# Patient Record
Sex: Female | Born: 1975 | Race: White | Hispanic: No | Marital: Married | Smoking: Never smoker
Health system: Southern US, Community
[De-identification: ages and names within clinical notes are randomized; demographics above are authoritative.]

## PROBLEM LIST (undated history)

## (undated) DIAGNOSIS — F329 Major depressive disorder, single episode, unspecified: Secondary | ICD-10-CM

## (undated) DIAGNOSIS — F32A Depression, unspecified: Secondary | ICD-10-CM

## (undated) HISTORY — DX: Major depressive disorder, single episode, unspecified: F32.9

## (undated) HISTORY — DX: Depression, unspecified: F32.A

---

## 2002-07-13 ENCOUNTER — Inpatient Hospital Stay (HOSPITAL_COMMUNITY): Admission: AD | Admit: 2002-07-13 | Discharge: 2002-07-16 | Payer: Self-pay | Admitting: Obstetrics and Gynecology

## 2002-08-31 ENCOUNTER — Other Ambulatory Visit: Admission: RE | Admit: 2002-08-31 | Discharge: 2002-08-31 | Payer: Self-pay | Admitting: Obstetrics and Gynecology

## 2002-10-31 ENCOUNTER — Encounter: Admission: RE | Admit: 2002-10-31 | Discharge: 2002-11-30 | Payer: Self-pay | Admitting: Obstetrics and Gynecology

## 2002-12-01 ENCOUNTER — Encounter: Admission: RE | Admit: 2002-12-01 | Discharge: 2002-12-31 | Payer: Self-pay | Admitting: Obstetrics and Gynecology

## 2007-11-09 ENCOUNTER — Ambulatory Visit (HOSPITAL_COMMUNITY): Admission: RE | Admit: 2007-11-09 | Discharge: 2007-11-09 | Payer: Self-pay | Admitting: Obstetrics and Gynecology

## 2010-07-21 NOTE — Op Note (Signed)
NAMENANDA, BITTICK               ACCOUNT NO.:  [id number]   MEDICAL RECORD NO.:  [id number]          PATIENT TYPE:  AMB   LOCATION:  SDC                           FACILITY:  [facility]   PHYSICIAN:  Michelle L. Grewal, M.D.DATE OF BIRTH:  [birth year]   DATE OF PROCEDURE:  DATE OF DISCHARGE:                               OPERATIVE REPORT   PREOPERATIVE DIAGNOSIS:  Retained intrauterine device.   POSTOPERATIVE DIAGNOSIS:  Retained intrauterine device.   PROCEDURE:  Cervical dilation, removal of Mirena IUD and insertion of  new Mirena IUD.   SURGEON:  Michelle L. Vincente Poli, MD   ANESTHESIA:  Local with IV sedation.   FINDINGS:  IUD with string wrapped around the IUD apparatus.   ESTIMATED BLOOD LOSS:  Minimal.   PROCEDURE:  The patient was taken to the operating room after consent  was obtained.  She was prepped and draped in usual fashion.  In-and-out  catheter was used to empty the bladder.  The speculum was inserted into  the vagina.  The cervix was grasped with a tenaculum.  No IUD strings  were seen.  Paracervical block was performed in standard fashion.  The  cervix was gently dilated using Pratt dilators.  I then inserted a sharp  uterine curette and easily pulled the IUD down through the cervix.  The  strings were wrapped around the IUD apparatus; this is why it was not  removable in the office.  I then inserted a brand new Mirena IUD  according to [employer] specifications and the strings were trimmed.  There was minimal bleeding noted.  The patient tolerated the procedure  very well.  All sponge, lap, and instrument counts were correct x2.  The  patient went to recovery room in stable condition.      Michelle L. Vincente Poli, M.D.  Electronically Signed     MLG/MEDQ  D:  11/09/2007  T:  11/10/2007  Job:  [id number]

## 2010-07-24 NOTE — Op Note (Signed)
NAME:  Katherine Chavez, Katherine Chavez                         ACCOUNT NO.:  [id number]   MEDICAL RECORD NO.:  [id number]                   PATIENT TYPE:  INP   LOCATION:  9198                                 FACILITY:  [facility]   PHYSICIAN:  Michelle L. Vincente Poli, M.D.            DATE OF BIRTH:  Nov 07, 1975   DATE OF PROCEDURE:  07/13/2002  DATE OF DISCHARGE:                                 OPERATIVE REPORT   PREOPERATIVE DIAGNOSES:  1. Intrauterine pregnancy at term.  2. Previous cesarean section x2.   POSTOPERATIVE DIAGNOSES:  1. Intrauterine pregnancy at term.  2. Previous cesarean section x2.   PROCEDURE:  Repeat low transverse cesarean section.   SURGEON:  Michelle L. Vincente Poli, M.D.   ASSISTANT:  Guy Sandifer. Henderson Cloud, M.D.   ANESTHESIA:  Spinal.   ESTIMATED BLOOD LOSS:  500 cc.   FINDINGS:  A female infant; Apgars 8 at one minute and 9 at five minutes.   DESCRIPTION OF PROCEDURE:  The patient was taken to the operating room.  She  was given her spinal without incident.  She was then prepped and draped in  the usual sterile fashion.  The Foley catheter was with drain to the  bladder.  Using a scalpel, a low transverse incision was made at the area of  previous cesarean section scar.  It was carried down to the fascia.  The  fascia was scored in the midline and extended laterally.  The Pfannenstiel  incision was developed.  The rectus muscles were separated in the midline.  The peritoneum was entered bluntly.  The peritoneal incision was then  stretched.  The bladder blade was then introduced into the peritoneal  cavity, and the bladder flap was then created sharply and then digitally;  the bladder blade was then readjusted.  A low transverse incision was made  in the uterus.  The amniotic fluid was noted to be clear.  The baby was in  cephalic presentation; a female infant and was delivered quite easily.  Apgars were 8 at one minute and 9 at five minutes.  The baby was handed to  the awaiting  pediatricians after cord was clamped.  Cord blood was then  obtained.  The placenta was manually removed; noted to be normal and intact.  The uterus was then cleared of all clots and debris.  The uterine incision  was closed with a single layer, using 0 chromic in a continuous running  stitch.  This was inspected and noted to be hemostatic.  Irrigation was  performed and hemostasis was noted.  The peritoneum was closed using 0  Vicryl in continuous running stitch, and the rectus muscles were  reapproximated using the same 0 Vicryl.  The fascia was hemostatic.  The  fascia was closed using  0 Vicryl in continuous stitch x2, starting at each corner and meeting in the  midline.  After irrigation of the subcutaneous layer and noting hemostasis,  the skin was closed with staples.  All sponge, lap and instrument counts  were correct x2.  The patient tolerated the procedure well and went to the  recovery room in stable condition.                                               Michelle L. Vincente Poli, M.D.    Florestine Avers  D:  07/13/2002  T:  07/14/2002  Job:  [id number]

## 2010-07-24 NOTE — Discharge Summary (Signed)
NAME:  Katherine Chavez, RAHRIG                         ACCOUNT NO.:  [id number]   MEDICAL RECORD NO.:  [id number]                   PATIENT TYPE:  INP   LOCATION:  9135                                 [facility]ACILITY:  [facility]   PHYSICIAN:  Juluis Mire, M.D.                DATE OF BIRTH:  [birth year]   DATE OF ADMISSION:  07/13/2002  DATE OF DISCHARGE:  07/16/2002                                 DISCHARGE SUMMARY   ADMISSION DIAGNOSES:  1. Intrauterine pregnancy at term.  2. Previous cesarean delivery x2 desires repeat.   DISCHARGE DIAGNOSES:  1. Status post low transverse cesarean section.  2. Viable female infant.   PROCEDURE:  Repeat low transverse cesarean section.   REASON FOR ADMISSION:  Please see written H&P.   HOSPITAL COURSE:  The patient was a gravida 3, para 3 that was admitted at  term for a scheduled repeat cesarean delivery.  On the morning of her  surgery, the patient was prepped accordingly and taken to the operating room  where a spinal anesthesia was administered without difficulty.  A low  transverse incision was made with the delivery of a viable female infant  weighing 7 pounds 1 ounce with Apgars of 8 at one minute and 9 at five  minutes.  The patient tolerated the procedure well and was taken to the  recovery room in stable condition.  On postoperative day #1, the patient had  good return of bowel function.  Abdomen was soft, fundus was firm and  nontender.  Abdominal dressing was noted to be clean, dry, and intact.  Labs  revealed a hemoglobin of 7.2, platelet count of 141,000, WBC count of 8.  On  postoperative day #2 vital signs were stable, the patient was afebrile,  fundus was firm and nontender.  Abdominal dressing was removed revealing an  incision that was clean, dry, and intact.  The patient was tolerating a  regular diet without complaints of nausea and vomiting and was started on  iron supplementation.  The patient was Rh negative, the baby was determined  to be Rh positive.  RhoGAM was administered.  On postoperative day #3, the  fundus was firm and nontender, abdominal incision was clean, dry, and  intact.  The staples were removed and the patient was discharged home.   CONDITION ON DISCHARGE:  Good.   DIET:  Regular as tolerated.   ACTIVITY:  No heavy lifting, no driving x2 weeks, no vaginal entry.   FOLLOW UP:  The patient is to followup in the office in 1-2 weeks for an  incision check.  She is to call for a temperature greater than 100 degrees,  persistent nausea and vomiting, heavy vaginal bleeding, and/or redness or  drainage from the incisional site.   DISCHARGE MEDICATIONS:  1. Tylox, dispense #30, one p.o. every 4-6 hours p.r.n.  2.     Ferrous sulfate 325 mg on  p.o. b.i.d. with meals.  3. Prenatal vitamins one p.o. daily.  4. Colace one p.o. daily p.r.n.     Julio Sicks, N.P.                        Juluis Mire, M.D.    CC/MEDQ  D:  08/13/2002  T:  08/13/2002  Job:  [id number]

## 2010-12-09 LAB — CBC
MCHC: 33.7
Platelets: 257
RBC: 4.07
RDW: 12.3

## 2010-12-09 LAB — PREGNANCY, URINE: Preg Test, Ur: NEGATIVE

## 2013-02-22 ENCOUNTER — Other Ambulatory Visit: Payer: Self-pay | Admitting: Obstetrics and Gynecology

## 2013-02-22 DIAGNOSIS — R928 Other abnormal and inconclusive findings on diagnostic imaging of breast: Secondary | ICD-10-CM

## 2013-03-07 ENCOUNTER — Other Ambulatory Visit: Payer: Self-pay

## 2013-03-12 ENCOUNTER — Ambulatory Visit
Admission: RE | Admit: 2013-03-12 | Discharge: 2013-03-12 | Disposition: A | Discharge status: Home / Self Care | Payer: 59 | Source: Ambulatory Visit | Attending: Obstetrics and Gynecology | Admitting: Obstetrics and Gynecology

## 2013-03-12 DIAGNOSIS — R928 Other abnormal and inconclusive findings on diagnostic imaging of breast: Secondary | ICD-10-CM

## 2016-02-05 ENCOUNTER — Telehealth (HOSPITAL_COMMUNITY): Payer: Self-pay

## 2016-02-05 NOTE — Telephone Encounter (Signed)
PT l/m on 02/03/16 @ 1214pm / Attempted to call pt on 11/29 & 11/30 Not able to l/m on v/mail, because it's full. Please ask pt to call me at [phone]. [phone]

## 2016-02-12 ENCOUNTER — Telehealth (HOSPITAL_COMMUNITY): Payer: Self-pay

## 2016-02-12 NOTE — Telephone Encounter (Signed)
Called pt back on 02/12/16 @1002am  LMOM

## 2016-02-20 ENCOUNTER — Encounter (HOSPITAL_COMMUNITY): Payer: Self-pay

## 2016-04-20 NOTE — Progress Notes (Signed)
Psychiatric Initial Adult Assessment   Patient Identification: Katherine Chavez MRN:  [id number] Date of Evaluation:  04/21/2016 Referral Source: [facility] Chief Complaint:   Chief Complaint    Depression; New Evaluation     Visit Diagnosis: No diagnosis found.  History of Present Illness:   Katherine BerryLaura G Chavez is a 41 year old female with depression who is referred for depression.   Patient states that she came here for depression, although it took time for her to make an appointment. She talks about her daughter who overdosed on medication on her birthday in May 2017, and the oldest daughter who left house for college. She also talks about exchange student form [place] who will be leaving in June this year. She realized that her mood has been getting worse in October 2017 and she stayed in bed all day long until her children come back to the house. She reports difficulty sharing her emotion with her husband, as he will take it as who she is (with depression).   She endorses insomnia. She gained weight due to increased appetite. She endorses anhedonia and does not do any activity although she used to enjoy P[employer] committed. She denies SI, stating that she has children. She reports anxiety, panic attacks and tends to ruminate on things. She denies AH/VH. She denies decreased need for sleep or euphoria. She drinks a glass of wine at times. She denies drug use.  Associated Signs/Symptoms: Depression Symptoms:  depressed mood, anhedonia, insomnia, fatigue, feelings of worthlessness/guilt, difficulty concentrating, (Hypo) Manic Symptoms:  denies Anxiety Symptoms:  Excessive Worry, Panic Symptoms, Psychotic Symptoms:  denies PTSD Symptoms: Negative  Past Psychiatric History:  Outpatient: denies  Psychiatry admission: once at age 41 for depression, was "not functioning" Previous suicide attempt: denies Past trials of medication: lithium, sertraline,  History of  violence: denies  Previous Psychotropic Medications: Yes   Substance Abuse History in the last 12 months:  No.  Consequences of Substance Abuse: NA  Past Medical History: History reviewed. No pertinent past medical history. History reviewed. No pertinent surgical history.  Family Psychiatric History:  42 year old daughter attempted suicide, maternal side of her family has excessive alcohol use   Family History: History reviewed. No pertinent family history.  Social History:   Social History   Social History  . Marital status: Married    Spouse name: N/A  . Number of children: N/A  . Years of education: N/A   Social History Main Topics  . Smoking status: None  . Smokeless tobacco: None  . Alcohol use None  . Drug use: Unknown  . Sexual activity: Not Asked   Other Topics Concern  . None   Social History Narrative  . None    Additional Social History:  Lives in [place] for nine years Married for 20 years, has three children age 42,16,13 Born and grew up in [place] until [place] school, she reports her relationship with her parents are not close Education: college  Allergies:  Allergies not on file  Metabolic Disorder Labs: No results found for: HGBA1C, MPG No results found for: PROLACTIN No results found for: CHOL, TRIG, HDL, CHOLHDL, VLDL, LDLCALC   Current Medications: Current Outpatient Prescriptions  Medication Sig Dispense Refill  . traZODone (DESYREL) 100 MG tablet Take 100 mg by mouth at bedtime.     No current facility-administered medications for this visit.     Neurologic: Headache: No Seizure: No Paresthesias:No  Musculoskeletal: Strength & Muscle Tone: within normal limits  Gait & Station: normal Patient leans: N/A  Psychiatric Specialty Exam: Review of Systems  Psychiatric/Behavioral: Positive for depression. Negative for hallucinations, substance abuse and suicidal ideas. The patient is nervous/anxious and has insomnia.   All other  systems reviewed and are negative.   Blood pressure 128/88, pulse 99, height 5\' 6"  (1.676 m), weight 169 lb 3.2 oz (76.7 kg).Body mass index is 27.31 kg/m.  General Appearance: Fairly Groomed  Eye Contact:  Good  Speech:  Clear and Coherent  Volume:  Normal  Mood:  Depressed  Affect:  Tearful  Thought Process:  Coherent and Goal Directed  Orientation:  Full (Time, Place, and Person)  Thought Content:  Logical Perceptions: denies AH/VH  Suicidal Thoughts:  No  Homicidal Thoughts:  No  Memory:  Immediate;   Good Recent;   Good Remote;   Good  Judgement:  Good  Insight:  Fair  Psychomotor Activity:  Normal  Concentration:  Concentration: Good and Attention Span: Good  Recall:  Good  Fund of Knowledge:Good  Language: Good  Akathisia:  No  Handed:  Right  AIMS (if indicated):  N/A  Assets:  Communication Skills Desire for Improvement  ADL's:  Intact  Cognition: WNL  Sleep:  poor   Assessment Katherine Chavez is a 41 year old female with depression who is referred for depression.   # MDD Exam is notable for her tearful affect and patient endorses neurovegetative symptoms in the setting of suicide of her daughter last year and the other daughter left for college. Will start fluoxetine to target her mood symptoms. Discussed behavioral activation. She will greatly benefit from supportive therapy/CBT; will make a referral.  # Insomnia Discussed sleep hygiene. Will start Ambien for a short term. Discussed risk of sedation and dependence. Patient is advised to hold Trazodone when she takes Ambien.   Plan 1. Start fluoxetine 10 mg daily for two weeks, then 20 mg daily 2. Start Ambien 5 mg at night as needed for sleep 3. Return to clinic in one month 4. Make an appointment with a therapist  The patient demonstrates the following risk factors for suicide: Chronic risk factors for suicide include: psychiatric disorder of depression. Acute risk factors for suicide include: family or  marital conflict and unemployment. Protective factors for this patient include: responsibility to others (children, family), coping skills and hope for the future. Considering these factors, the overall suicide risk at this point appears to be low. Patient is appropriate for outpatient follow up.   Treatment Plan Summary: Plan as above   Neysa Hotter, MD 2/14/20182:13 PM

## 2016-04-21 ENCOUNTER — Ambulatory Visit (INDEPENDENT_AMBULATORY_CARE_PROVIDER_SITE_OTHER): Payer: 59 | Admitting: Psychiatry

## 2016-04-21 ENCOUNTER — Encounter (HOSPITAL_COMMUNITY): Payer: Self-pay | Admitting: Psychiatry

## 2016-04-21 ENCOUNTER — Encounter (INDEPENDENT_AMBULATORY_CARE_PROVIDER_SITE_OTHER): Payer: Self-pay

## 2016-04-21 VITALS — BP 128/88 | HR 99 | Ht 66.0 in | Wt 169.2 lb

## 2016-04-21 DIAGNOSIS — G4700 Insomnia, unspecified: Secondary | ICD-10-CM | POA: Diagnosis not present

## 2016-04-21 DIAGNOSIS — Z811 Family history of alcohol abuse and dependence: Secondary | ICD-10-CM | POA: Diagnosis not present

## 2016-04-21 DIAGNOSIS — Z79899 Other long term (current) drug therapy: Secondary | ICD-10-CM

## 2016-04-21 DIAGNOSIS — F331 Major depressive disorder, recurrent, moderate: Secondary | ICD-10-CM | POA: Diagnosis not present

## 2016-04-21 DIAGNOSIS — Z818 Family history of other mental and behavioral disorders: Secondary | ICD-10-CM

## 2016-04-21 MED ORDER — FLUOXETINE HCL 10 MG PO TABS: ORAL_TABLET | ORAL | 1 refills | Status: AC

## 2016-04-21 MED ORDER — ZOLPIDEM TARTRATE 5 MG PO TABS: 5.0000 mg | ORAL_TABLET | Freq: Every evening | ORAL | 0 refills | Status: AC | PRN

## 2016-04-21 NOTE — Patient Instructions (Addendum)
1. Start fluoxetine 10 mg daily for two weeks, then 20 mg daily 2. Start Ambien 5 mg at night as needed for sleep 3. Return to clinic in one month 4. Make an appointment with a therapist

## 2016-05-31 ENCOUNTER — Ambulatory Visit (HOSPITAL_COMMUNITY): Payer: Self-pay | Admitting: Psychiatry

## 2017-12-13 ENCOUNTER — Other Ambulatory Visit: Payer: Self-pay | Admitting: Obstetrics and Gynecology

## 2017-12-13 DIAGNOSIS — N6459 Other signs and symptoms in breast: Secondary | ICD-10-CM

## 2017-12-15 ENCOUNTER — Ambulatory Visit
Admission: RE | Admit: 2017-12-15 | Discharge: 2017-12-15 | Disposition: A | Discharge status: Home / Self Care | Payer: 59 | Source: Ambulatory Visit | Attending: Obstetrics and Gynecology | Admitting: Obstetrics and Gynecology

## 2017-12-15 DIAGNOSIS — N6459 Other signs and symptoms in breast: Secondary | ICD-10-CM

## 2018-04-11 ENCOUNTER — Other Ambulatory Visit: Payer: Self-pay

## 2018-04-11 ENCOUNTER — Encounter (HOSPITAL_BASED_OUTPATIENT_CLINIC_OR_DEPARTMENT_OTHER): Payer: Self-pay | Admitting: Emergency Medicine

## 2018-04-11 DIAGNOSIS — S0181XA Laceration without foreign body of other part of head, initial encounter: Secondary | ICD-10-CM | POA: Diagnosis not present

## 2018-04-11 DIAGNOSIS — W19XXXA Unspecified fall, initial encounter: Secondary | ICD-10-CM | POA: Diagnosis not present

## 2018-04-11 DIAGNOSIS — Y939 Activity, unspecified: Secondary | ICD-10-CM | POA: Diagnosis not present

## 2018-04-11 DIAGNOSIS — Y999 Unspecified external cause status: Secondary | ICD-10-CM | POA: Diagnosis not present

## 2018-04-11 DIAGNOSIS — Y929 Unspecified place or not applicable: Secondary | ICD-10-CM | POA: Diagnosis not present

## 2018-04-11 NOTE — ED Triage Notes (Signed)
Pt having a small laceration on her chin after she fell tonight. Wound cleaned and dressing applied on triage.

## 2018-04-12 ENCOUNTER — Emergency Department (HOSPITAL_BASED_OUTPATIENT_CLINIC_OR_DEPARTMENT_OTHER)
Admission: EM | Admit: 2018-04-12 | Discharge: 2018-04-12 | Disposition: A | Discharge status: Home / Self Care | Payer: BLUE CROSS/BLUE SHIELD | Attending: Emergency Medicine | Admitting: Emergency Medicine

## 2018-04-12 DIAGNOSIS — Z23 Encounter for immunization: Secondary | ICD-10-CM | POA: Diagnosis not present

## 2018-04-12 DIAGNOSIS — Z043 Encounter for examination and observation following other accident: Secondary | ICD-10-CM | POA: Diagnosis not present

## 2018-04-12 DIAGNOSIS — S0181XA Laceration without foreign body of other part of head, initial encounter: Secondary | ICD-10-CM | POA: Diagnosis not present

## 2018-04-12 DIAGNOSIS — G8911 Acute pain due to trauma: Secondary | ICD-10-CM | POA: Diagnosis not present

## 2018-04-14 DIAGNOSIS — S0181XA Laceration without foreign body of other part of head, initial encounter: Secondary | ICD-10-CM | POA: Diagnosis not present

## 2018-05-09 ENCOUNTER — Encounter (HOSPITAL_COMMUNITY): Payer: Self-pay

## 2018-05-09 ENCOUNTER — Other Ambulatory Visit: Payer: Self-pay

## 2018-05-09 ENCOUNTER — Emergency Department (HOSPITAL_COMMUNITY)
Admission: EM | Admit: 2018-05-09 | Discharge: 2018-05-09 | Disposition: A | Discharge status: Home / Self Care | Payer: BLUE CROSS/BLUE SHIELD | Attending: Emergency Medicine | Admitting: Emergency Medicine

## 2018-05-09 DIAGNOSIS — F329 Major depressive disorder, single episode, unspecified: Secondary | ICD-10-CM | POA: Insufficient documentation

## 2018-05-09 DIAGNOSIS — Z5321 Procedure and treatment not carried out due to patient leaving prior to being seen by health care provider: Secondary | ICD-10-CM | POA: Diagnosis not present

## 2018-05-09 LAB — COMPREHENSIVE METABOLIC PANEL
ALBUMIN: 4.5 g/dL (ref 3.5–5.0)
ALK PHOS: 64 U/L (ref 38–126)
ALT: 12 U/L (ref 0–44)
AST: 22 U/L (ref 15–41)
Anion gap: 10 (ref 5–15)
BUN: 10 mg/dL (ref 6–20)
CALCIUM: 9.6 mg/dL (ref 8.9–10.3)
CO2: 24 mmol/L (ref 22–32)
Chloride: 103 mmol/L (ref 98–111)
Creatinine, Ser: 0.68 mg/dL (ref 0.44–1.00)
GFR calc Af Amer: >60 mL/min (ref >60)
GFR calc non Af Amer: >60 mL/min (ref >60)
GLUCOSE: 102 mg/dL — AB (ref 70–99)
Potassium: 4.3 mmol/L (ref 3.5–5.1)
SODIUM: 137 mmol/L (ref 135–145)
Total Bilirubin: 0.5 mg/dL (ref 0.3–1.2)
Total Protein: 7.9 g/dL (ref 6.5–8.1)

## 2018-05-09 LAB — RAPID URINE DRUG SCREEN, HOSP PERFORMED
Amphetamines: NOT DETECTED
BARBITURATES: NOT DETECTED
Benzodiazepines: POSITIVE — AB
Cocaine: NOT DETECTED
OPIATES: NOT DETECTED
TETRAHYDROCANNABINOL: NOT DETECTED

## 2018-05-09 LAB — CBC
HEMATOCRIT: 38.1 % (ref 36.0–46.0)
HEMOGLOBIN: 12.2 g/dL (ref 12.0–15.0)
MCH: 31.5 pg (ref 26.0–34.0)
MCHC: 32 g/dL (ref 30.0–36.0)
MCV: 98.4 fL (ref 80.0–100.0)
Platelets: 265 10*3/uL (ref 150–400)
RBC: 3.87 MIL/uL (ref 3.87–5.11)
RDW: 12.7 % (ref 11.5–15.5)
WBC: 5 10*3/uL (ref 4.0–10.5)
nRBC: 0 % (ref 0.0–0.2)

## 2018-05-09 LAB — I-STAT BETA HCG BLOOD, ED (MC, WL, AP ONLY): I-stat hCG, quantitative: <5 m[IU]/mL (ref <5)

## 2018-05-09 LAB — SALICYLATE LEVEL: Salicylate Lvl: <7 mg/dL (ref 2.8–30.0)

## 2018-05-09 LAB — ACETAMINOPHEN LEVEL: Acetaminophen (Tylenol), Serum: <10 ug/mL — ABNORMAL LOW (ref 10–30)

## 2018-05-09 LAB — ETHANOL: Alcohol, Ethyl (B): <10 mg/dL (ref <10)

## 2018-05-09 NOTE — ED Notes (Addendum)
No reply for registration x4.

## 2018-05-09 NOTE — ED Triage Notes (Signed)
Pt reports increased depression for the past few months but has gotten worse lately. Pt denies SI/HI, no auditory or visual hallucinations. Pt denies drug use to does report drinking a bottle of wine a day. Pt calm and cooperative in triage.

## 2018-05-09 NOTE — ED Notes (Addendum)
Pt not seen in lobby at this time. Pt called for x4 no reply.

## 2018-05-10 DIAGNOSIS — I10 Essential (primary) hypertension: Secondary | ICD-10-CM | POA: Diagnosis not present

## 2018-05-10 DIAGNOSIS — F332 Major depressive disorder, recurrent severe without psychotic features: Secondary | ICD-10-CM | POA: Diagnosis not present

## 2018-05-10 DIAGNOSIS — F1020 Alcohol dependence, uncomplicated: Secondary | ICD-10-CM | POA: Diagnosis not present

## 2018-05-11 DIAGNOSIS — F332 Major depressive disorder, recurrent severe without psychotic features: Secondary | ICD-10-CM | POA: Diagnosis not present

## 2018-05-11 DIAGNOSIS — F1020 Alcohol dependence, uncomplicated: Secondary | ICD-10-CM | POA: Diagnosis not present

## 2018-05-12 DIAGNOSIS — F332 Major depressive disorder, recurrent severe without psychotic features: Secondary | ICD-10-CM | POA: Diagnosis not present

## 2018-05-12 DIAGNOSIS — F1020 Alcohol dependence, uncomplicated: Secondary | ICD-10-CM | POA: Diagnosis not present

## 2018-05-13 DIAGNOSIS — F1020 Alcohol dependence, uncomplicated: Secondary | ICD-10-CM | POA: Diagnosis not present

## 2018-05-13 DIAGNOSIS — F332 Major depressive disorder, recurrent severe without psychotic features: Secondary | ICD-10-CM | POA: Diagnosis not present

## 2018-05-14 DIAGNOSIS — F332 Major depressive disorder, recurrent severe without psychotic features: Secondary | ICD-10-CM | POA: Diagnosis not present

## 2018-05-14 DIAGNOSIS — F1020 Alcohol dependence, uncomplicated: Secondary | ICD-10-CM | POA: Diagnosis not present

## 2018-05-15 DIAGNOSIS — F332 Major depressive disorder, recurrent severe without psychotic features: Secondary | ICD-10-CM | POA: Diagnosis not present

## 2018-05-16 DIAGNOSIS — F332 Major depressive disorder, recurrent severe without psychotic features: Secondary | ICD-10-CM | POA: Diagnosis not present

## 2018-05-16 DIAGNOSIS — F1020 Alcohol dependence, uncomplicated: Secondary | ICD-10-CM | POA: Diagnosis not present

## 2018-06-04 DIAGNOSIS — S335XXA Sprain of ligaments of lumbar spine, initial encounter: Secondary | ICD-10-CM | POA: Diagnosis not present

## 2018-06-28 DIAGNOSIS — M545 Low back pain: Secondary | ICD-10-CM | POA: Diagnosis not present

## 2018-09-29 DIAGNOSIS — Z1159 Encounter for screening for other viral diseases: Secondary | ICD-10-CM | POA: Diagnosis not present

## 2018-11-06 DIAGNOSIS — Z1159 Encounter for screening for other viral diseases: Secondary | ICD-10-CM | POA: Diagnosis not present

## 2018-11-29 DIAGNOSIS — M79642 Pain in left hand: Secondary | ICD-10-CM | POA: Diagnosis not present

## 2018-12-15 DIAGNOSIS — Z20828 Contact with and (suspected) exposure to other viral communicable diseases: Secondary | ICD-10-CM | POA: Diagnosis not present

## 2018-12-15 DIAGNOSIS — Z03818 Encounter for observation for suspected exposure to other biological agents ruled out: Secondary | ICD-10-CM | POA: Diagnosis not present

## 2019-01-13 DIAGNOSIS — Z03818 Encounter for observation for suspected exposure to other biological agents ruled out: Secondary | ICD-10-CM | POA: Diagnosis not present

## 2019-01-13 DIAGNOSIS — Z20828 Contact with and (suspected) exposure to other viral communicable diseases: Secondary | ICD-10-CM | POA: Diagnosis not present

## 2019-01-31 ENCOUNTER — Other Ambulatory Visit: Payer: Self-pay

## 2019-01-31 DIAGNOSIS — Z20822 Contact with and (suspected) exposure to covid-19: Secondary | ICD-10-CM

## 2019-02-01 LAB — NOVEL CORONAVIRUS, NAA: SARS-CoV-2, NAA: NOT DETECTED

## 2019-06-13 DIAGNOSIS — S335XXA Sprain of ligaments of lumbar spine, initial encounter: Secondary | ICD-10-CM | POA: Diagnosis not present

## 2019-06-16 IMAGING — MG DIGITAL DIAGNOSTIC BILATERAL MAMMOGRAM WITH TOMO AND CAD
8 series · 8 of 24 positions shown · non-contrast
Comparison: Previous screening mammogram dated 02/19/2013.

CLINICAL DATA: Patient describes thickening with tenderness in the
outer LEFT breast since [REDACTED], improved status post course of
antibiotics for presumed mastitis.

EXAM:
DIGITAL DIAGNOSTIC BILATERAL MAMMOGRAM WITH CAD AND TOMO
ULTRASOUND LEFT BREAST

[R MLO synth-2D]
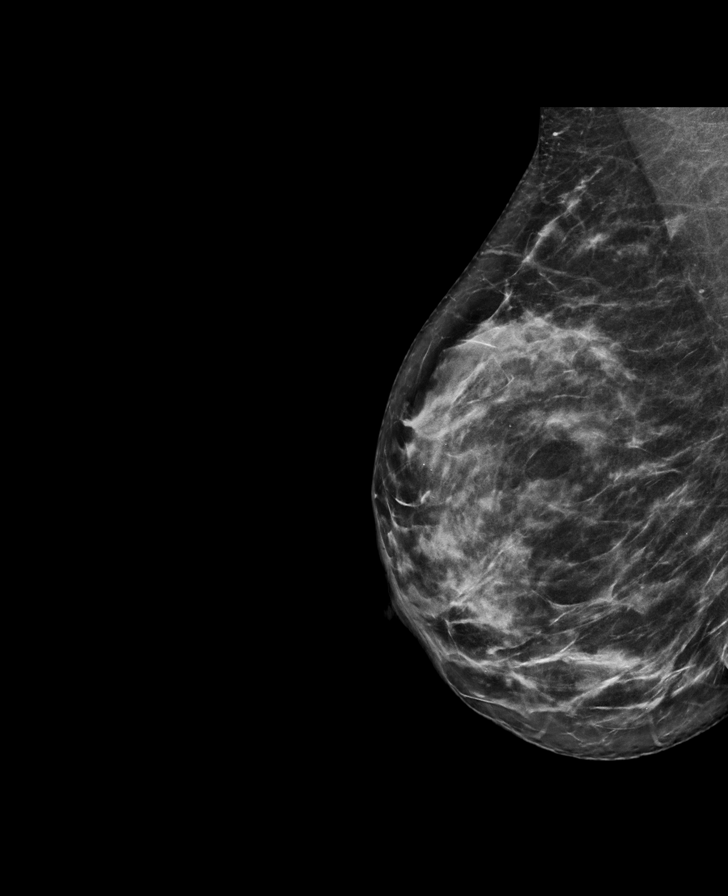

[L CC synth-2D]
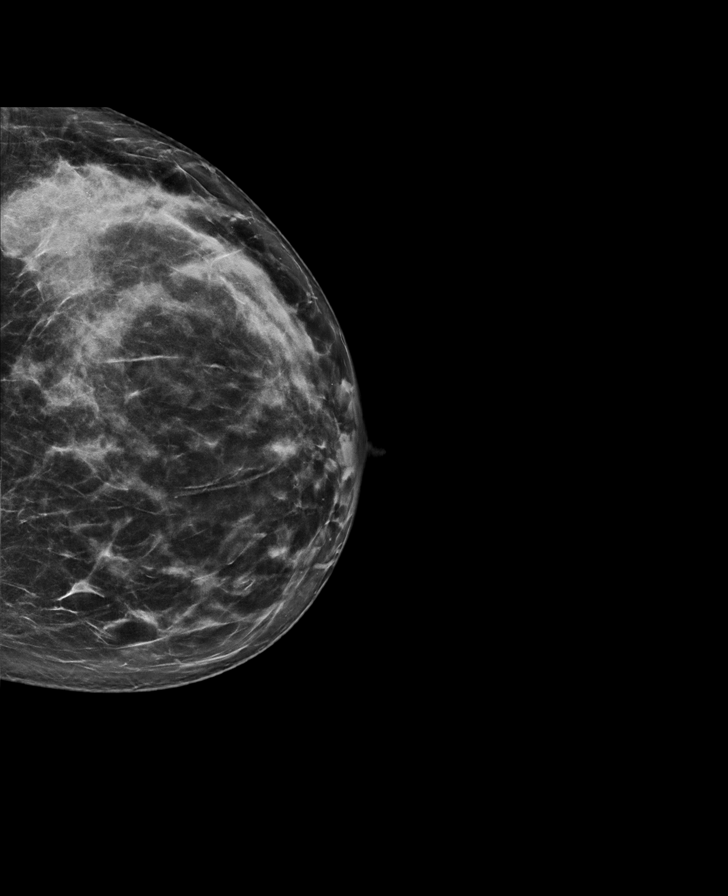

[R CC synth-2D]
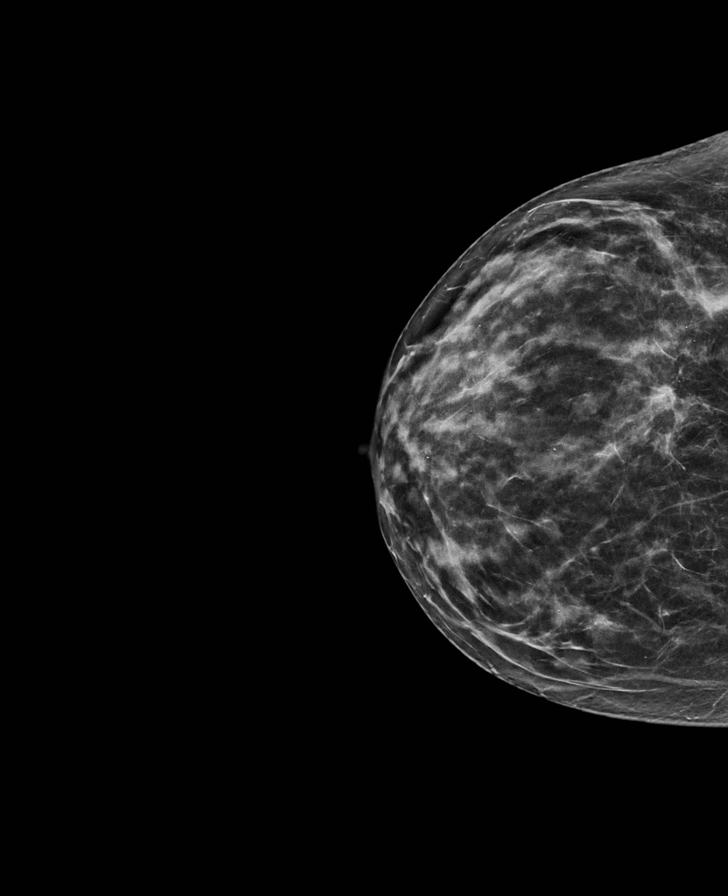

[L MLO synth-2D]
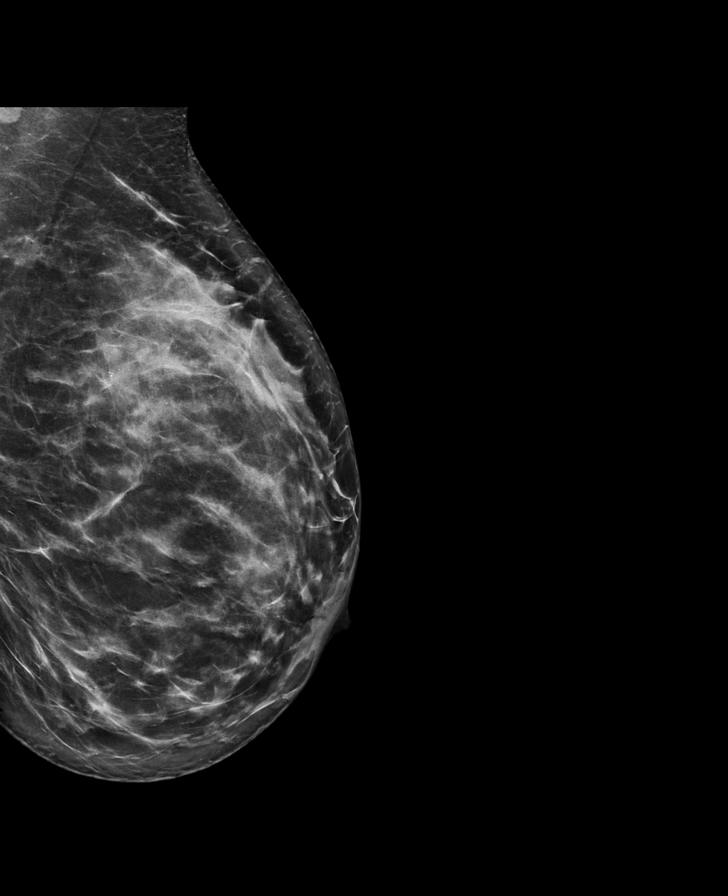

[R CC tomo · tomo slice 31/62.0]
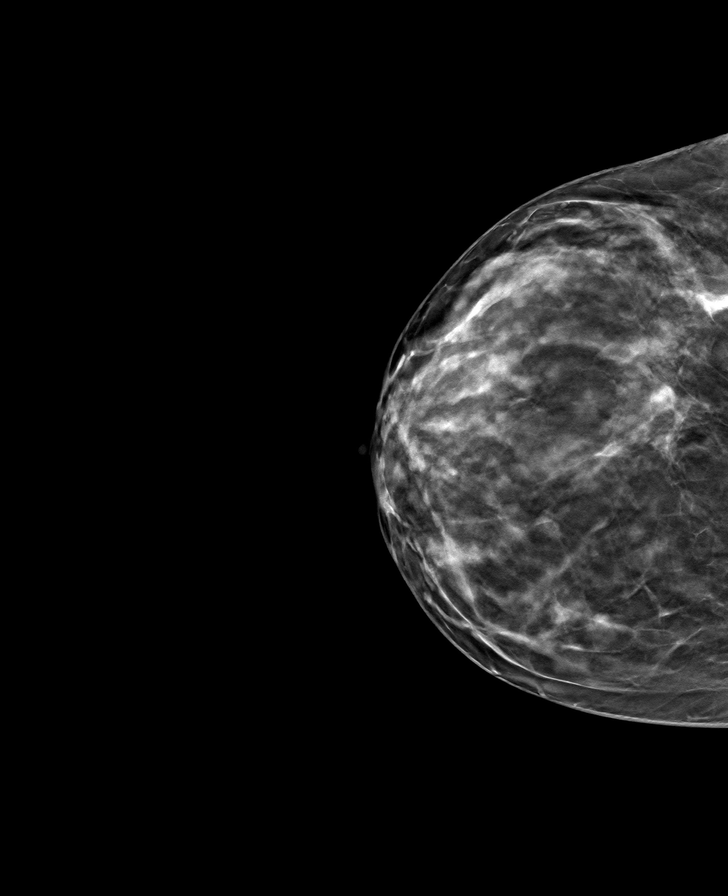

[R MLO tomo · tomo slice 36/71.0]
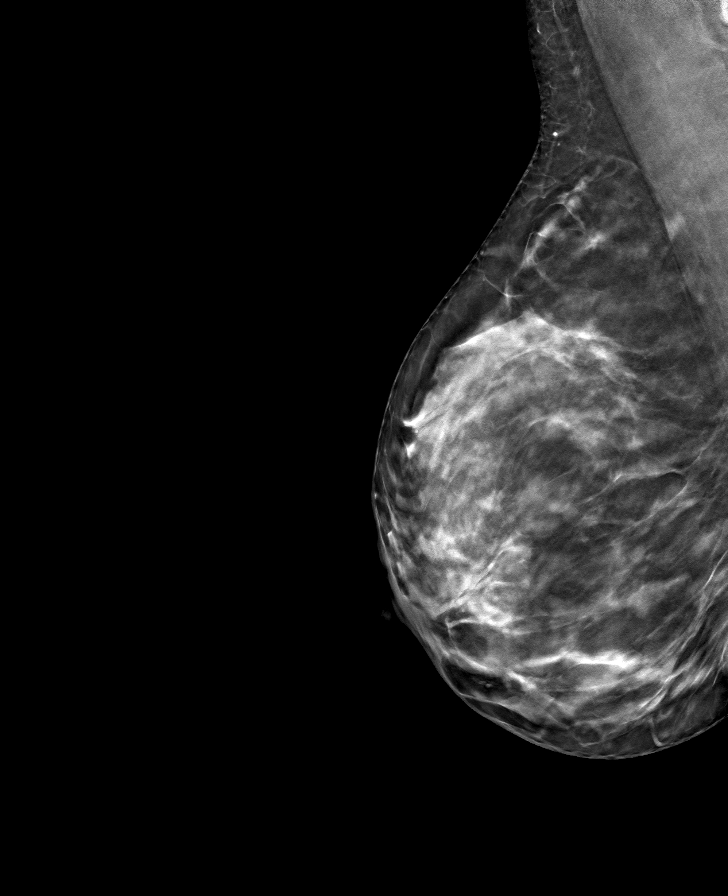

[L CC tomo · tomo slice 37/73.0]
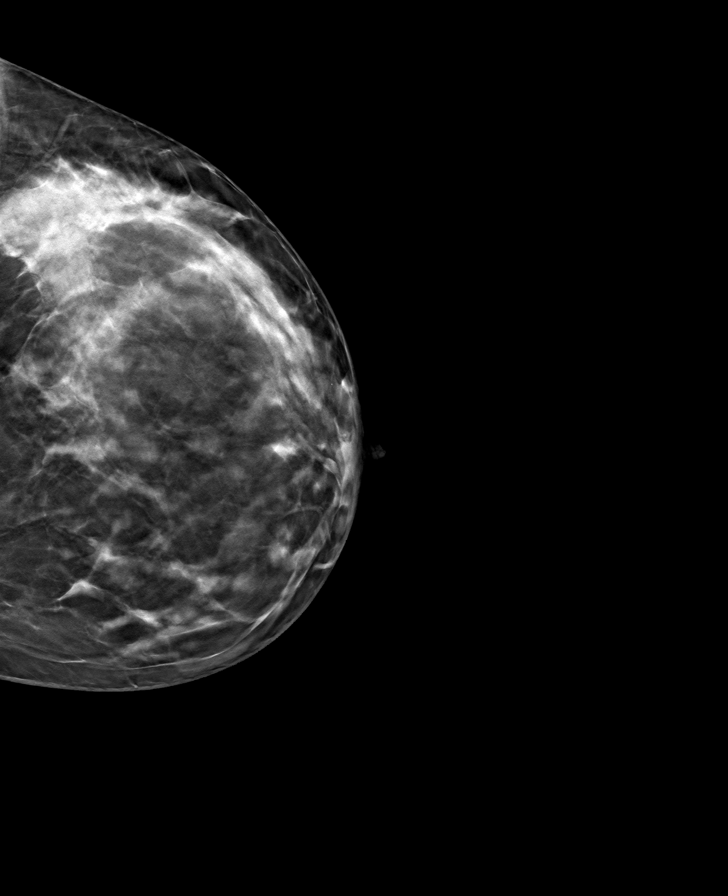

[L MLO tomo · tomo slice 39/76.0]
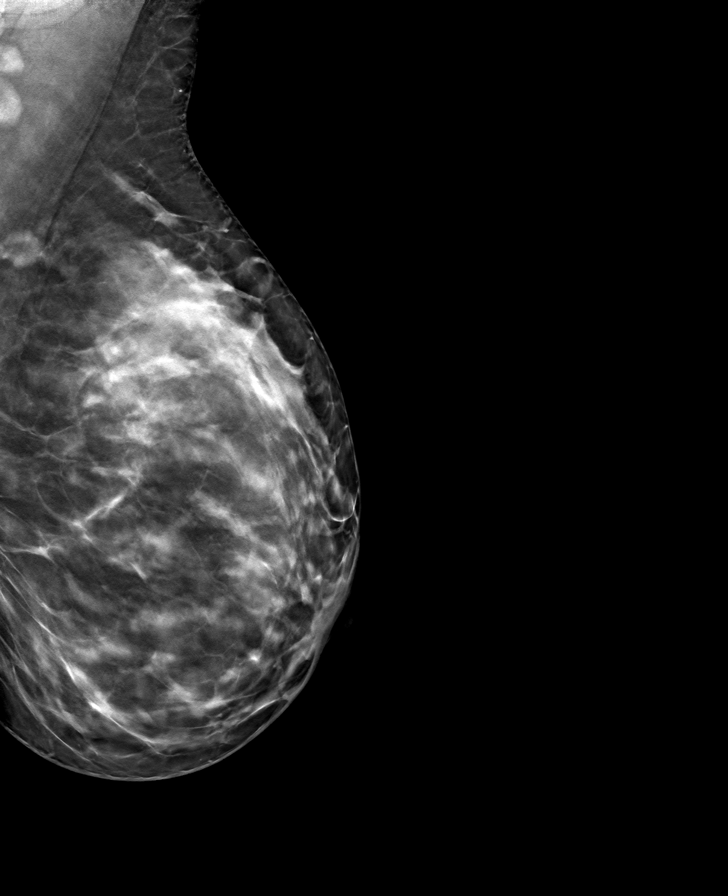

[8 of 24 positions shown; findings below may reference images not displayed]

ACR Breast Density Category c: The breast tissue is heterogeneously
dense, which may obscure small masses.
FINDINGS: There are no new dominant masses, suspicious calcifications or
secondary signs of malignancy within either breast. Specifically,
there is no mammographic abnormality within the outer LEFT breast
corresponding to the area of clinical concern.

Mammographic images were processed with CAD.

On physical exam, there is no skin redness today. There is vague
thickening within the outer LEFT breast without evidence of
circumscribed mass.

Targeted ultrasound is performed, evaluating the outer LEFT breast
as directed by the patient, showing only normal fibroglandular
tissues and fat lobules. There is a ridge of normal dense
fibroglandular tissue within the LEFT breast at the 2-4 o'clock axis
corresponding to the area of clinical concern. No solid or cystic
mass. No fluid collection or abscess. No skin thickening.
IMPRESSION: No evidence of malignancy within either breast. Specifically, no
evidence of malignancy or abscess within the outer LEFT breast
corresponding to the area of clinical concern. Patient states that
the skin redness has resolved and that the firmness/thickening
within the outer LEFT breast has decreased suggesting a resolved
mastitis.

RECOMMENDATION:
1.  Screening mammogram in one year.(Code:20-U-CUD)
2. The patient was instructed to return sooner if the area that she
feels becomes larger and/or firmer to palpation, if the skin redness
returns or if a new palpable abnormality is identified in either
breast.

I have discussed the findings and recommendations with the patient.
Results were also provided in writing at the conclusion of the
visit. If applicable, a reminder letter will be sent to the patient
regarding the next appointment.

BI-RADS CATEGORY  1: Negative.

## 2019-06-16 IMAGING — US ULTRASOUND LEFT BREAST LIMITED
1 series · 3 of 3 positions shown · non-contrast
Comparison: Previous screening mammogram dated 02/19/2013.

CLINICAL DATA: Patient describes thickening with tenderness in the
outer LEFT breast since [REDACTED], improved status post course of
antibiotics for presumed mastitis.

EXAM:
DIGITAL DIAGNOSTIC BILATERAL MAMMOGRAM WITH CAD AND TOMO
ULTRASOUND LEFT BREAST

[Series 1: ultrasound left breast limited · 0.07mm/px · 3 of 3 slices shown]
[im 1/3]
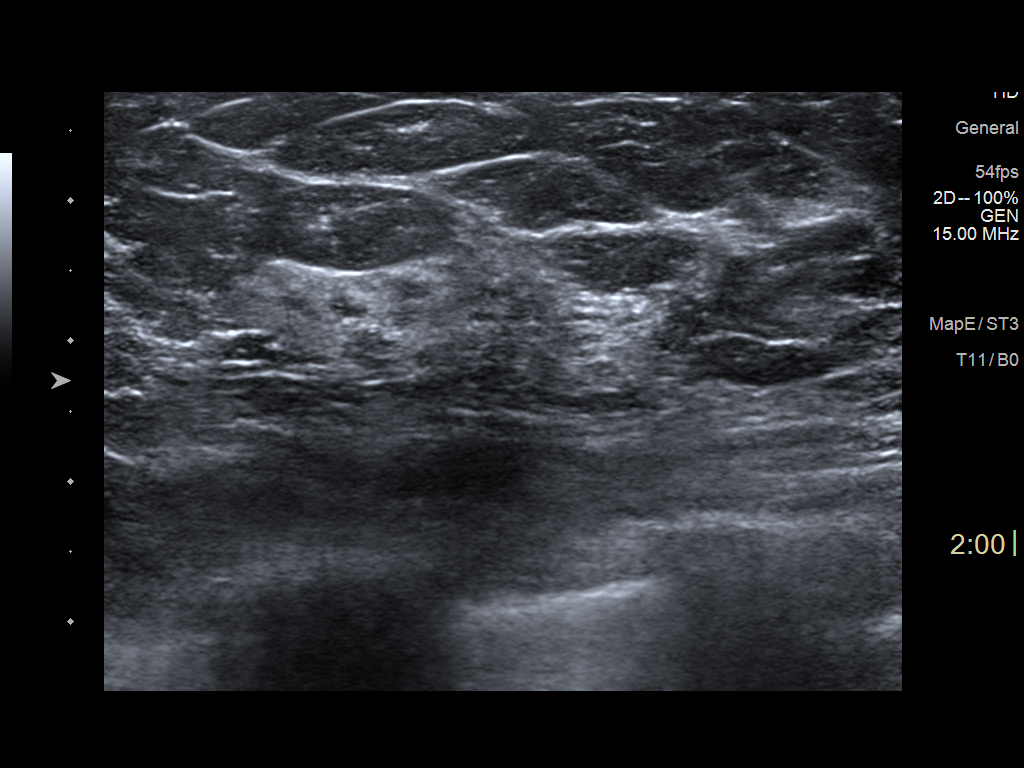
[im 2/3]
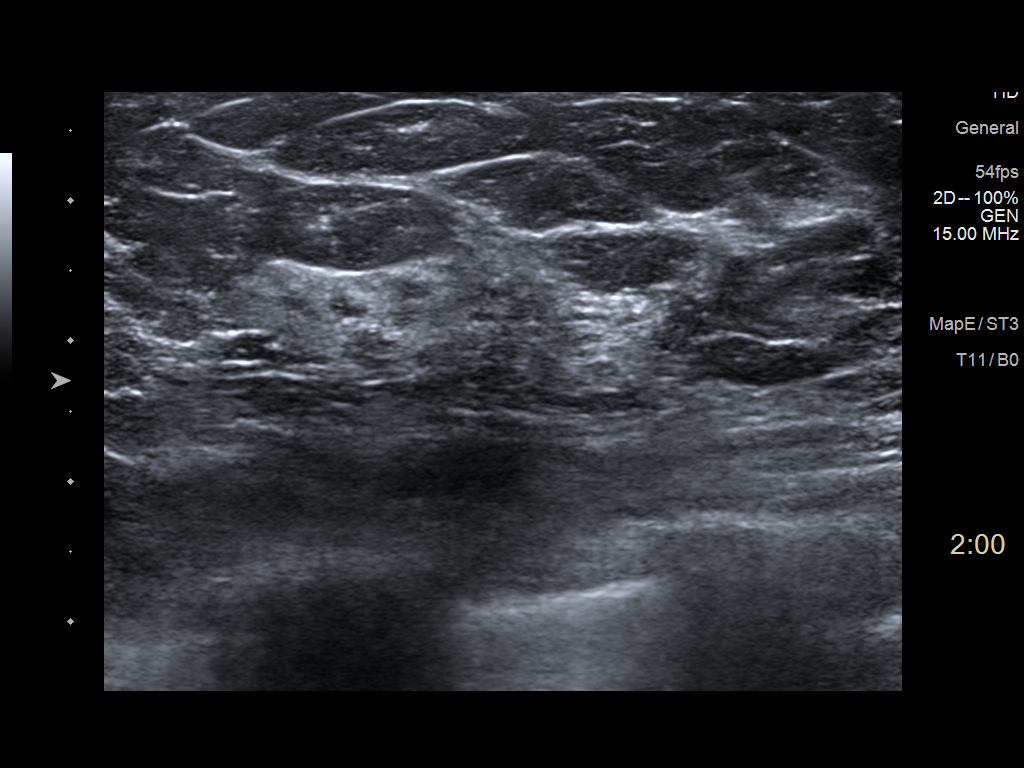
[im 3/3]
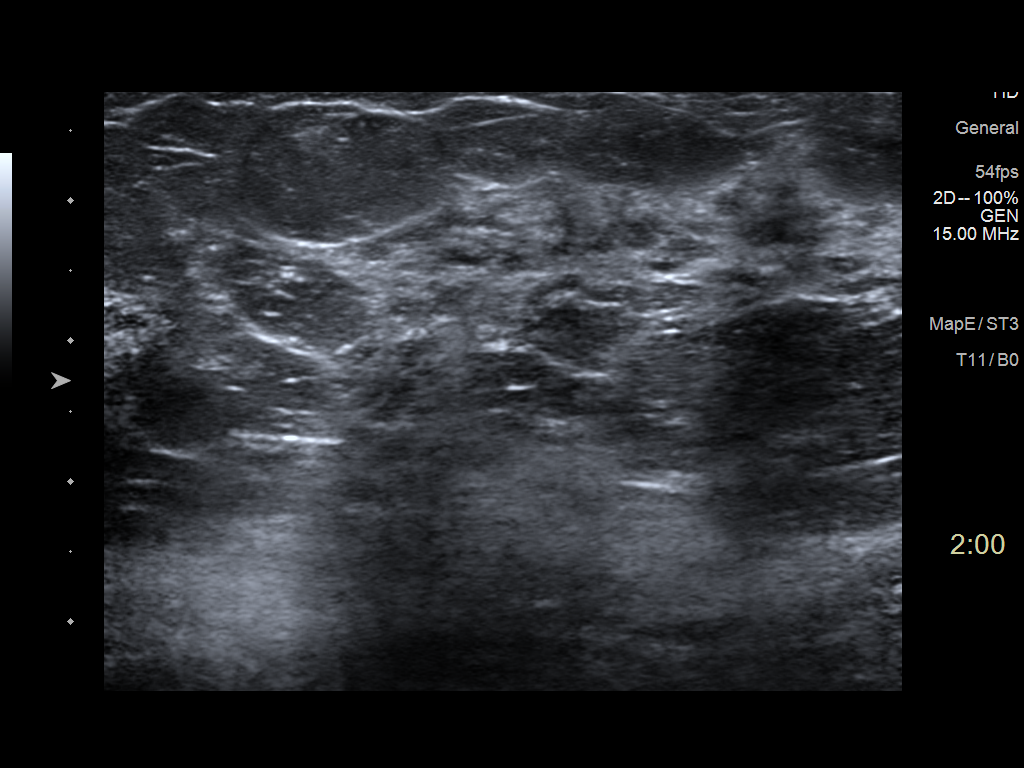

[3 of 3 positions shown; findings below may reference images not displayed]

ACR Breast Density Category c: The breast tissue is heterogeneously
dense, which may obscure small masses.
FINDINGS: There are no new dominant masses, suspicious calcifications or
secondary signs of malignancy within either breast. Specifically,
there is no mammographic abnormality within the outer LEFT breast
corresponding to the area of clinical concern.

Mammographic images were processed with CAD.

On physical exam, there is no skin redness today. There is vague
thickening within the outer LEFT breast without evidence of
circumscribed mass.

Targeted ultrasound is performed, evaluating the outer LEFT breast
as directed by the patient, showing only normal fibroglandular
tissues and fat lobules. There is a ridge of normal dense
fibroglandular tissue within the LEFT breast at the 2-4 o'clock axis
corresponding to the area of clinical concern. No solid or cystic
mass. No fluid collection or abscess. No skin thickening.
IMPRESSION: No evidence of malignancy within either breast. Specifically, no
evidence of malignancy or abscess within the outer LEFT breast
corresponding to the area of clinical concern. Patient states that
the skin redness has resolved and that the firmness/thickening
within the outer LEFT breast has decreased suggesting a resolved
mastitis.

RECOMMENDATION:
1.  Screening mammogram in one year.(Code:20-U-CUD)
2. The patient was instructed to return sooner if the area that she
feels becomes larger and/or firmer to palpation, if the skin redness
returns or if a new palpable abnormality is identified in either
breast.

I have discussed the findings and recommendations with the patient.
Results were also provided in writing at the conclusion of the
visit. If applicable, a reminder letter will be sent to the patient
regarding the next appointment.

BI-RADS CATEGORY  1: Negative.

## 2019-06-27 ENCOUNTER — Other Ambulatory Visit: Payer: Self-pay | Admitting: Sports Medicine

## 2019-06-27 DIAGNOSIS — M545 Low back pain, unspecified: Secondary | ICD-10-CM

## 2019-07-25 ENCOUNTER — Other Ambulatory Visit: Payer: BLUE CROSS/BLUE SHIELD

## 2019-08-05 DIAGNOSIS — N3001 Acute cystitis with hematuria: Secondary | ICD-10-CM | POA: Diagnosis not present

## 2019-08-05 DIAGNOSIS — R350 Frequency of micturition: Secondary | ICD-10-CM | POA: Diagnosis not present

## 2019-08-05 DIAGNOSIS — R309 Painful micturition, unspecified: Secondary | ICD-10-CM | POA: Diagnosis not present

## 2019-11-20 DIAGNOSIS — F1020 Alcohol dependence, uncomplicated: Secondary | ICD-10-CM | POA: Diagnosis not present

## 2019-11-27 DIAGNOSIS — R109 Unspecified abdominal pain: Secondary | ICD-10-CM | POA: Diagnosis not present

## 2019-11-27 DIAGNOSIS — R399 Unspecified symptoms and signs involving the genitourinary system: Secondary | ICD-10-CM | POA: Diagnosis not present

## 2020-01-04 DIAGNOSIS — F1020 Alcohol dependence, uncomplicated: Secondary | ICD-10-CM | POA: Diagnosis not present

## 2020-02-07 DIAGNOSIS — F1020 Alcohol dependence, uncomplicated: Secondary | ICD-10-CM | POA: Diagnosis not present

## 2020-02-20 DIAGNOSIS — F1020 Alcohol dependence, uncomplicated: Secondary | ICD-10-CM | POA: Diagnosis not present

## 2020-03-14 DIAGNOSIS — F1020 Alcohol dependence, uncomplicated: Secondary | ICD-10-CM | POA: Diagnosis not present

## 2020-03-28 DIAGNOSIS — F1020 Alcohol dependence, uncomplicated: Secondary | ICD-10-CM | POA: Diagnosis not present

## 2020-04-14 DIAGNOSIS — S52502A Unspecified fracture of the lower end of left radius, initial encounter for closed fracture: Secondary | ICD-10-CM | POA: Diagnosis not present

## 2020-04-22 DIAGNOSIS — S52502A Unspecified fracture of the lower end of left radius, initial encounter for closed fracture: Secondary | ICD-10-CM | POA: Diagnosis not present

## 2020-04-25 DIAGNOSIS — F1020 Alcohol dependence, uncomplicated: Secondary | ICD-10-CM | POA: Diagnosis not present

## 2020-05-07 DIAGNOSIS — S52502D Unspecified fracture of the lower end of left radius, subsequent encounter for closed fracture with routine healing: Secondary | ICD-10-CM | POA: Diagnosis not present

## 2020-05-09 DIAGNOSIS — S52502D Unspecified fracture of the lower end of left radius, subsequent encounter for closed fracture with routine healing: Secondary | ICD-10-CM | POA: Diagnosis not present

## 2020-05-09 DIAGNOSIS — F1020 Alcohol dependence, uncomplicated: Secondary | ICD-10-CM | POA: Diagnosis not present

## 2020-05-14 DIAGNOSIS — F1020 Alcohol dependence, uncomplicated: Secondary | ICD-10-CM | POA: Diagnosis not present

## 2020-05-23 DIAGNOSIS — F1020 Alcohol dependence, uncomplicated: Secondary | ICD-10-CM | POA: Diagnosis not present

## 2020-05-26 DIAGNOSIS — S52502D Unspecified fracture of the lower end of left radius, subsequent encounter for closed fracture with routine healing: Secondary | ICD-10-CM | POA: Diagnosis not present

## 2020-05-28 DIAGNOSIS — M25532 Pain in left wrist: Secondary | ICD-10-CM | POA: Diagnosis not present

## 2020-05-28 DIAGNOSIS — S52515E Nondisplaced fracture of left radial styloid process, subsequent encounter for open fracture type I or II with routine healing: Secondary | ICD-10-CM | POA: Diagnosis not present

## 2020-05-28 DIAGNOSIS — M6281 Muscle weakness (generalized): Secondary | ICD-10-CM | POA: Diagnosis not present

## 2020-05-28 DIAGNOSIS — M25632 Stiffness of left wrist, not elsewhere classified: Secondary | ICD-10-CM | POA: Diagnosis not present

## 2020-06-06 DIAGNOSIS — M25632 Stiffness of left wrist, not elsewhere classified: Secondary | ICD-10-CM | POA: Diagnosis not present

## 2020-06-06 DIAGNOSIS — F1020 Alcohol dependence, uncomplicated: Secondary | ICD-10-CM | POA: Diagnosis not present

## 2020-06-06 DIAGNOSIS — S52515E Nondisplaced fracture of left radial styloid process, subsequent encounter for open fracture type I or II with routine healing: Secondary | ICD-10-CM | POA: Diagnosis not present

## 2020-06-06 DIAGNOSIS — M25532 Pain in left wrist: Secondary | ICD-10-CM | POA: Diagnosis not present

## 2020-06-06 DIAGNOSIS — M6281 Muscle weakness (generalized): Secondary | ICD-10-CM | POA: Diagnosis not present

## 2020-06-11 DIAGNOSIS — S52515E Nondisplaced fracture of left radial styloid process, subsequent encounter for open fracture type I or II with routine healing: Secondary | ICD-10-CM | POA: Diagnosis not present

## 2020-06-11 DIAGNOSIS — M25532 Pain in left wrist: Secondary | ICD-10-CM | POA: Diagnosis not present

## 2020-06-11 DIAGNOSIS — M25632 Stiffness of left wrist, not elsewhere classified: Secondary | ICD-10-CM | POA: Diagnosis not present

## 2020-06-11 DIAGNOSIS — M6281 Muscle weakness (generalized): Secondary | ICD-10-CM | POA: Diagnosis not present

## 2020-06-23 DIAGNOSIS — M6281 Muscle weakness (generalized): Secondary | ICD-10-CM | POA: Diagnosis not present

## 2020-06-23 DIAGNOSIS — M25632 Stiffness of left wrist, not elsewhere classified: Secondary | ICD-10-CM | POA: Diagnosis not present

## 2020-06-23 DIAGNOSIS — M25532 Pain in left wrist: Secondary | ICD-10-CM | POA: Diagnosis not present

## 2020-06-23 DIAGNOSIS — S52515E Nondisplaced fracture of left radial styloid process, subsequent encounter for open fracture type I or II with routine healing: Secondary | ICD-10-CM | POA: Diagnosis not present

## 2020-08-06 DIAGNOSIS — F1020 Alcohol dependence, uncomplicated: Secondary | ICD-10-CM | POA: Diagnosis not present

## 2020-08-29 DIAGNOSIS — R300 Dysuria: Secondary | ICD-10-CM | POA: Diagnosis not present

## 2020-08-29 DIAGNOSIS — R3989 Other symptoms and signs involving the genitourinary system: Secondary | ICD-10-CM | POA: Diagnosis not present

## 2020-09-05 DIAGNOSIS — D2261 Melanocytic nevi of right upper limb, including shoulder: Secondary | ICD-10-CM | POA: Diagnosis not present

## 2020-09-05 DIAGNOSIS — D2371 Other benign neoplasm of skin of right lower limb, including hip: Secondary | ICD-10-CM | POA: Diagnosis not present

## 2020-09-05 DIAGNOSIS — L723 Sebaceous cyst: Secondary | ICD-10-CM | POA: Diagnosis not present

## 2020-09-05 DIAGNOSIS — D2262 Melanocytic nevi of left upper limb, including shoulder: Secondary | ICD-10-CM | POA: Diagnosis not present

## 2020-11-12 DIAGNOSIS — F1020 Alcohol dependence, uncomplicated: Secondary | ICD-10-CM | POA: Diagnosis not present

## 2021-02-10 DIAGNOSIS — Z23 Encounter for immunization: Secondary | ICD-10-CM | POA: Diagnosis not present

## 2021-02-10 DIAGNOSIS — R350 Frequency of micturition: Secondary | ICD-10-CM | POA: Diagnosis not present

## 2021-02-11 DIAGNOSIS — F1021 Alcohol dependence, in remission: Secondary | ICD-10-CM | POA: Diagnosis not present

## 2021-02-11 DIAGNOSIS — F1020 Alcohol dependence, uncomplicated: Secondary | ICD-10-CM | POA: Diagnosis not present

## 2021-02-17 DIAGNOSIS — R350 Frequency of micturition: Secondary | ICD-10-CM | POA: Diagnosis not present

## 2021-03-16 DIAGNOSIS — F1020 Alcohol dependence, uncomplicated: Secondary | ICD-10-CM | POA: Diagnosis not present

## 2021-03-18 DIAGNOSIS — R8271 Bacteriuria: Secondary | ICD-10-CM | POA: Diagnosis not present

## 2021-03-18 DIAGNOSIS — R3915 Urgency of urination: Secondary | ICD-10-CM | POA: Diagnosis not present

## 2021-03-18 DIAGNOSIS — N3020 Other chronic cystitis without hematuria: Secondary | ICD-10-CM | POA: Diagnosis not present

## 2021-03-18 DIAGNOSIS — R1084 Generalized abdominal pain: Secondary | ICD-10-CM | POA: Diagnosis not present

## 2021-03-30 DIAGNOSIS — F1020 Alcohol dependence, uncomplicated: Secondary | ICD-10-CM | POA: Diagnosis not present

## 2021-05-06 DIAGNOSIS — F1020 Alcohol dependence, uncomplicated: Secondary | ICD-10-CM | POA: Diagnosis not present

## 2021-05-11 DIAGNOSIS — F1020 Alcohol dependence, uncomplicated: Secondary | ICD-10-CM | POA: Diagnosis not present

## 2021-05-25 DIAGNOSIS — F1020 Alcohol dependence, uncomplicated: Secondary | ICD-10-CM | POA: Diagnosis not present

## 2021-05-28 DIAGNOSIS — F411 Generalized anxiety disorder: Secondary | ICD-10-CM | POA: Diagnosis not present

## 2021-05-28 DIAGNOSIS — F900 Attention-deficit hyperactivity disorder, predominantly inattentive type: Secondary | ICD-10-CM | POA: Diagnosis not present

## 2021-05-28 DIAGNOSIS — F1011 Alcohol abuse, in remission: Secondary | ICD-10-CM | POA: Diagnosis not present

## 2021-06-01 DIAGNOSIS — Z79891 Long term (current) use of opiate analgesic: Secondary | ICD-10-CM | POA: Diagnosis not present

## 2021-06-08 DIAGNOSIS — F1020 Alcohol dependence, uncomplicated: Secondary | ICD-10-CM | POA: Diagnosis not present

## 2021-06-25 DIAGNOSIS — F1011 Alcohol abuse, in remission: Secondary | ICD-10-CM | POA: Diagnosis not present

## 2021-06-25 DIAGNOSIS — F900 Attention-deficit hyperactivity disorder, predominantly inattentive type: Secondary | ICD-10-CM | POA: Diagnosis not present

## 2021-06-25 DIAGNOSIS — F411 Generalized anxiety disorder: Secondary | ICD-10-CM | POA: Diagnosis not present

## 2021-09-22 DIAGNOSIS — F900 Attention-deficit hyperactivity disorder, predominantly inattentive type: Secondary | ICD-10-CM | POA: Diagnosis not present

## 2021-09-22 DIAGNOSIS — F1011 Alcohol abuse, in remission: Secondary | ICD-10-CM | POA: Diagnosis not present

## 2021-09-22 DIAGNOSIS — F411 Generalized anxiety disorder: Secondary | ICD-10-CM | POA: Diagnosis not present

## 2021-10-26 DIAGNOSIS — J209 Acute bronchitis, unspecified: Secondary | ICD-10-CM | POA: Diagnosis not present

## 2021-10-26 DIAGNOSIS — R053 Chronic cough: Secondary | ICD-10-CM | POA: Diagnosis not present

## 2021-10-28 DIAGNOSIS — F411 Generalized anxiety disorder: Secondary | ICD-10-CM | POA: Diagnosis not present

## 2021-10-28 DIAGNOSIS — F900 Attention-deficit hyperactivity disorder, predominantly inattentive type: Secondary | ICD-10-CM | POA: Diagnosis not present

## 2021-10-28 DIAGNOSIS — F1011 Alcohol abuse, in remission: Secondary | ICD-10-CM | POA: Diagnosis not present

## 2021-11-27 DIAGNOSIS — F900 Attention-deficit hyperactivity disorder, predominantly inattentive type: Secondary | ICD-10-CM | POA: Diagnosis not present

## 2021-11-27 DIAGNOSIS — F411 Generalized anxiety disorder: Secondary | ICD-10-CM | POA: Diagnosis not present

## 2021-11-27 DIAGNOSIS — F1011 Alcohol abuse, in remission: Secondary | ICD-10-CM | POA: Diagnosis not present

## 2021-12-08 DIAGNOSIS — F900 Attention-deficit hyperactivity disorder, predominantly inattentive type: Secondary | ICD-10-CM | POA: Diagnosis not present

## 2021-12-08 DIAGNOSIS — F1011 Alcohol abuse, in remission: Secondary | ICD-10-CM | POA: Diagnosis not present

## 2021-12-08 DIAGNOSIS — F411 Generalized anxiety disorder: Secondary | ICD-10-CM | POA: Diagnosis not present

## 2021-12-24 DIAGNOSIS — F411 Generalized anxiety disorder: Secondary | ICD-10-CM | POA: Diagnosis not present

## 2021-12-24 DIAGNOSIS — F900 Attention-deficit hyperactivity disorder, predominantly inattentive type: Secondary | ICD-10-CM | POA: Diagnosis not present

## 2021-12-24 DIAGNOSIS — F1011 Alcohol abuse, in remission: Secondary | ICD-10-CM | POA: Diagnosis not present

## 2022-01-06 DIAGNOSIS — F411 Generalized anxiety disorder: Secondary | ICD-10-CM | POA: Diagnosis not present

## 2022-01-06 DIAGNOSIS — F1011 Alcohol abuse, in remission: Secondary | ICD-10-CM | POA: Diagnosis not present

## 2022-01-06 DIAGNOSIS — F900 Attention-deficit hyperactivity disorder, predominantly inattentive type: Secondary | ICD-10-CM | POA: Diagnosis not present

## 2022-01-21 DIAGNOSIS — F1011 Alcohol abuse, in remission: Secondary | ICD-10-CM | POA: Diagnosis not present

## 2022-01-21 DIAGNOSIS — F411 Generalized anxiety disorder: Secondary | ICD-10-CM | POA: Diagnosis not present

## 2022-01-21 DIAGNOSIS — F900 Attention-deficit hyperactivity disorder, predominantly inattentive type: Secondary | ICD-10-CM | POA: Diagnosis not present

## 2022-02-04 DIAGNOSIS — F1011 Alcohol abuse, in remission: Secondary | ICD-10-CM | POA: Diagnosis not present

## 2022-02-04 DIAGNOSIS — F411 Generalized anxiety disorder: Secondary | ICD-10-CM | POA: Diagnosis not present

## 2022-02-04 DIAGNOSIS — F900 Attention-deficit hyperactivity disorder, predominantly inattentive type: Secondary | ICD-10-CM | POA: Diagnosis not present

## 2022-02-10 DIAGNOSIS — F900 Attention-deficit hyperactivity disorder, predominantly inattentive type: Secondary | ICD-10-CM | POA: Diagnosis not present

## 2022-02-10 DIAGNOSIS — F411 Generalized anxiety disorder: Secondary | ICD-10-CM | POA: Diagnosis not present

## 2022-02-10 DIAGNOSIS — F1011 Alcohol abuse, in remission: Secondary | ICD-10-CM | POA: Diagnosis not present

## 2022-03-09 DIAGNOSIS — F1011 Alcohol abuse, in remission: Secondary | ICD-10-CM | POA: Diagnosis not present

## 2022-03-09 DIAGNOSIS — F411 Generalized anxiety disorder: Secondary | ICD-10-CM | POA: Diagnosis not present

## 2022-03-09 DIAGNOSIS — F900 Attention-deficit hyperactivity disorder, predominantly inattentive type: Secondary | ICD-10-CM | POA: Diagnosis not present

## 2022-03-10 DIAGNOSIS — F1011 Alcohol abuse, in remission: Secondary | ICD-10-CM | POA: Diagnosis not present

## 2022-03-10 DIAGNOSIS — F900 Attention-deficit hyperactivity disorder, predominantly inattentive type: Secondary | ICD-10-CM | POA: Diagnosis not present

## 2022-03-10 DIAGNOSIS — F411 Generalized anxiety disorder: Secondary | ICD-10-CM | POA: Diagnosis not present

## 2022-03-25 DIAGNOSIS — F900 Attention-deficit hyperactivity disorder, predominantly inattentive type: Secondary | ICD-10-CM | POA: Diagnosis not present

## 2022-03-25 DIAGNOSIS — F1011 Alcohol abuse, in remission: Secondary | ICD-10-CM | POA: Diagnosis not present

## 2022-03-25 DIAGNOSIS — F411 Generalized anxiety disorder: Secondary | ICD-10-CM | POA: Diagnosis not present

## 2022-04-06 DIAGNOSIS — F411 Generalized anxiety disorder: Secondary | ICD-10-CM | POA: Diagnosis not present

## 2022-04-06 DIAGNOSIS — F900 Attention-deficit hyperactivity disorder, predominantly inattentive type: Secondary | ICD-10-CM | POA: Diagnosis not present

## 2022-04-06 DIAGNOSIS — F1011 Alcohol abuse, in remission: Secondary | ICD-10-CM | POA: Diagnosis not present

## 2022-04-07 DIAGNOSIS — F411 Generalized anxiety disorder: Secondary | ICD-10-CM | POA: Diagnosis not present

## 2022-04-07 DIAGNOSIS — F900 Attention-deficit hyperactivity disorder, predominantly inattentive type: Secondary | ICD-10-CM | POA: Diagnosis not present

## 2022-04-07 DIAGNOSIS — F1011 Alcohol abuse, in remission: Secondary | ICD-10-CM | POA: Diagnosis not present

## 2022-04-27 DIAGNOSIS — F900 Attention-deficit hyperactivity disorder, predominantly inattentive type: Secondary | ICD-10-CM | POA: Diagnosis not present

## 2022-04-27 DIAGNOSIS — F1011 Alcohol abuse, in remission: Secondary | ICD-10-CM | POA: Diagnosis not present

## 2022-04-27 DIAGNOSIS — F411 Generalized anxiety disorder: Secondary | ICD-10-CM | POA: Diagnosis not present

## 2022-05-07 DIAGNOSIS — F900 Attention-deficit hyperactivity disorder, predominantly inattentive type: Secondary | ICD-10-CM | POA: Diagnosis not present

## 2022-05-07 DIAGNOSIS — F1011 Alcohol abuse, in remission: Secondary | ICD-10-CM | POA: Diagnosis not present

## 2022-05-07 DIAGNOSIS — F411 Generalized anxiety disorder: Secondary | ICD-10-CM | POA: Diagnosis not present

## 2022-05-07 DIAGNOSIS — F4321 Adjustment disorder with depressed mood: Secondary | ICD-10-CM | POA: Diagnosis not present

## 2022-05-10 DIAGNOSIS — M79642 Pain in left hand: Secondary | ICD-10-CM | POA: Diagnosis not present

## 2022-05-10 DIAGNOSIS — M79641 Pain in right hand: Secondary | ICD-10-CM | POA: Diagnosis not present

## 2022-05-10 DIAGNOSIS — R634 Abnormal weight loss: Secondary | ICD-10-CM | POA: Diagnosis not present

## 2022-05-13 DIAGNOSIS — F4321 Adjustment disorder with depressed mood: Secondary | ICD-10-CM | POA: Diagnosis not present

## 2022-05-13 DIAGNOSIS — F411 Generalized anxiety disorder: Secondary | ICD-10-CM | POA: Diagnosis not present

## 2022-05-13 DIAGNOSIS — F1011 Alcohol abuse, in remission: Secondary | ICD-10-CM | POA: Diagnosis not present

## 2022-05-13 DIAGNOSIS — F900 Attention-deficit hyperactivity disorder, predominantly inattentive type: Secondary | ICD-10-CM | POA: Diagnosis not present

## 2022-05-28 DIAGNOSIS — F4321 Adjustment disorder with depressed mood: Secondary | ICD-10-CM | POA: Diagnosis not present

## 2022-05-28 DIAGNOSIS — F900 Attention-deficit hyperactivity disorder, predominantly inattentive type: Secondary | ICD-10-CM | POA: Diagnosis not present

## 2022-05-28 DIAGNOSIS — F1011 Alcohol abuse, in remission: Secondary | ICD-10-CM | POA: Diagnosis not present

## 2022-05-28 DIAGNOSIS — F411 Generalized anxiety disorder: Secondary | ICD-10-CM | POA: Diagnosis not present

## 2022-06-09 DIAGNOSIS — F1011 Alcohol abuse, in remission: Secondary | ICD-10-CM | POA: Diagnosis not present

## 2022-06-09 DIAGNOSIS — F900 Attention-deficit hyperactivity disorder, predominantly inattentive type: Secondary | ICD-10-CM | POA: Diagnosis not present

## 2022-06-09 DIAGNOSIS — F4321 Adjustment disorder with depressed mood: Secondary | ICD-10-CM | POA: Diagnosis not present

## 2022-06-09 DIAGNOSIS — F411 Generalized anxiety disorder: Secondary | ICD-10-CM | POA: Diagnosis not present

## 2022-06-21 DIAGNOSIS — F411 Generalized anxiety disorder: Secondary | ICD-10-CM | POA: Diagnosis not present

## 2022-06-21 DIAGNOSIS — F4321 Adjustment disorder with depressed mood: Secondary | ICD-10-CM | POA: Diagnosis not present

## 2022-06-21 DIAGNOSIS — F900 Attention-deficit hyperactivity disorder, predominantly inattentive type: Secondary | ICD-10-CM | POA: Diagnosis not present

## 2022-06-21 DIAGNOSIS — F1011 Alcohol abuse, in remission: Secondary | ICD-10-CM | POA: Diagnosis not present

## 2022-07-12 DIAGNOSIS — F4321 Adjustment disorder with depressed mood: Secondary | ICD-10-CM | POA: Diagnosis not present

## 2022-07-12 DIAGNOSIS — F411 Generalized anxiety disorder: Secondary | ICD-10-CM | POA: Diagnosis not present

## 2022-07-12 DIAGNOSIS — F1011 Alcohol abuse, in remission: Secondary | ICD-10-CM | POA: Diagnosis not present

## 2022-07-12 DIAGNOSIS — F900 Attention-deficit hyperactivity disorder, predominantly inattentive type: Secondary | ICD-10-CM | POA: Diagnosis not present

## 2022-07-14 DIAGNOSIS — F900 Attention-deficit hyperactivity disorder, predominantly inattentive type: Secondary | ICD-10-CM | POA: Diagnosis not present

## 2022-07-14 DIAGNOSIS — F1011 Alcohol abuse, in remission: Secondary | ICD-10-CM | POA: Diagnosis not present

## 2022-07-14 DIAGNOSIS — F4321 Adjustment disorder with depressed mood: Secondary | ICD-10-CM | POA: Diagnosis not present

## 2022-07-14 DIAGNOSIS — F411 Generalized anxiety disorder: Secondary | ICD-10-CM | POA: Diagnosis not present

## 2022-09-04 ENCOUNTER — Emergency Department (HOSPITAL_BASED_OUTPATIENT_CLINIC_OR_DEPARTMENT_OTHER): Payer: BC Managed Care – PPO

## 2022-09-04 ENCOUNTER — Emergency Department (HOSPITAL_BASED_OUTPATIENT_CLINIC_OR_DEPARTMENT_OTHER)
Admission: EM | Admit: 2022-09-04 | Discharge: 2022-09-04 | Disposition: A | Discharge status: Home / Self Care | Payer: BC Managed Care – PPO | Attending: Emergency Medicine | Admitting: Emergency Medicine

## 2022-09-04 ENCOUNTER — Other Ambulatory Visit: Payer: Self-pay

## 2022-09-04 ENCOUNTER — Encounter (HOSPITAL_BASED_OUTPATIENT_CLINIC_OR_DEPARTMENT_OTHER): Payer: Self-pay

## 2022-09-04 DIAGNOSIS — R109 Unspecified abdominal pain: Secondary | ICD-10-CM | POA: Diagnosis not present

## 2022-09-04 DIAGNOSIS — R102 Pelvic and perineal pain: Secondary | ICD-10-CM | POA: Diagnosis not present

## 2022-09-04 DIAGNOSIS — N200 Calculus of kidney: Secondary | ICD-10-CM | POA: Diagnosis not present

## 2022-09-04 DIAGNOSIS — N132 Hydronephrosis with renal and ureteral calculous obstruction: Secondary | ICD-10-CM | POA: Diagnosis not present

## 2022-09-04 LAB — CBC WITH DIFFERENTIAL/PLATELET
Abs Immature Granulocytes: 0 10*3/uL (ref 0.00–0.07)
Basophils Absolute: 0 10*3/uL (ref 0.0–0.1)
Basophils Relative: 1 %
Eosinophils Absolute: 0.4 10*3/uL (ref 0.0–0.5)
Eosinophils Relative: 6 %
HCT: 36.8 % (ref 36.0–46.0)
Hemoglobin: 11.8 g/dL — ABNORMAL LOW (ref 12.0–15.0)
Immature Granulocytes: 0 %
Lymphocytes Relative: 27 %
Lymphs Abs: 1.6 10*3/uL (ref 0.7–4.0)
MCH: 29.9 pg (ref 26.0–34.0)
MCHC: 32.1 g/dL (ref 30.0–36.0)
MCV: 93.4 fL (ref 80.0–100.0)
Monocytes Absolute: 0.4 10*3/uL (ref 0.1–1.0)
Monocytes Relative: 7 %
Neutro Abs: 3.5 10*3/uL (ref 1.7–7.7)
Neutrophils Relative %: 59 %
Platelets: 285 10*3/uL (ref 150–400)
RBC: 3.94 MIL/uL (ref 3.87–5.11)
RDW: 12.5 % (ref 11.5–15.5)
WBC: 5.9 10*3/uL (ref 4.0–10.5)
nRBC: 0 % (ref 0.0–0.2)

## 2022-09-04 LAB — URINALYSIS, ROUTINE W REFLEX MICROSCOPIC
Glucose, UA: NEGATIVE mg/dL
Ketones, ur: NEGATIVE mg/dL
Nitrite: POSITIVE — AB
Protein, ur: 30 mg/dL — AB
Specific Gravity, Urine: 1.029 (ref 1.005–1.030)
WBC, UA: >50 WBC/hpf (ref 0–5)
pH: 6 (ref 5.0–8.0)

## 2022-09-04 LAB — BASIC METABOLIC PANEL
Anion gap: 7 (ref 5–15)
BUN: 16 mg/dL (ref 6–20)
CO2: 28 mmol/L (ref 22–32)
Calcium: 9.2 mg/dL (ref 8.9–10.3)
Chloride: 102 mmol/L (ref 98–111)
Creatinine, Ser: 0.74 mg/dL (ref 0.44–1.00)
GFR, Estimated: >60 mL/min (ref >60)
Glucose, Bld: 106 mg/dL — ABNORMAL HIGH (ref 70–99)
Potassium: 3 mmol/L — ABNORMAL LOW (ref 3.5–5.1)
Sodium: 137 mmol/L (ref 135–145)

## 2022-09-04 LAB — PREGNANCY, URINE: Preg Test, Ur: NEGATIVE

## 2022-09-04 MED ORDER — ONDANSETRON 4 MG PO TBDP: 4.0000 mg | ORAL_TABLET | Freq: Three times a day (TID) | ORAL | 0 refills | Status: AC | PRN

## 2022-09-04 MED ORDER — FENTANYL CITRATE PF 50 MCG/ML IJ SOSY
50.0000 ug | PREFILLED_SYRINGE | Freq: Once | INTRAMUSCULAR | Status: AC
  Administered 2022-09-04: 50 ug via INTRAVENOUS
  Filled 2022-09-04: qty 1

## 2022-09-04 MED ORDER — SODIUM CHLORIDE 0.9 % IV SOLN
2.0000 g | Freq: Once | INTRAVENOUS | Status: AC
  Administered 2022-09-04: 2 g via INTRAVENOUS
  Filled 2022-09-04: qty 20

## 2022-09-04 MED ORDER — LACTATED RINGERS IV BOLUS
1000.0000 mL | Freq: Once | INTRAVENOUS | Status: AC
  Administered 2022-09-04: 1000 mL via INTRAVENOUS

## 2022-09-04 MED ORDER — TAMSULOSIN HCL 0.4 MG PO CAPS: 0.4000 mg | ORAL_CAPSULE | Freq: Every day | ORAL | 0 refills | Status: AC

## 2022-09-04 MED ORDER — HYDROMORPHONE HCL 1 MG/ML IJ SOLN
1.0000 mg | Freq: Once | INTRAMUSCULAR | Status: AC
  Administered 2022-09-04: 1 mg via INTRAVENOUS
  Filled 2022-09-04: qty 1

## 2022-09-04 MED ORDER — CEPHALEXIN 500 MG PO CAPS: 500.0000 mg | ORAL_CAPSULE | Freq: Three times a day (TID) | ORAL | 0 refills | Status: AC

## 2022-09-04 MED ORDER — ONDANSETRON HCL 4 MG/2ML IJ SOLN
4.0000 mg | Freq: Once | INTRAMUSCULAR | Status: AC
Start: 2022-09-04 — End: 2022-09-04
  Administered 2022-09-04: 4 mg via INTRAVENOUS
  Filled 2022-09-04: qty 2

## 2022-09-04 MED ORDER — KETOROLAC TROMETHAMINE 30 MG/ML IJ SOLN
15.0000 mg | Freq: Once | INTRAMUSCULAR | Status: AC
  Administered 2022-09-04: 15 mg via INTRAVENOUS
  Filled 2022-09-04: qty 1

## 2022-09-04 MED ORDER — OXYCODONE HCL 5 MG PO TABS: 5.0000 mg | ORAL_TABLET | ORAL | 0 refills | Status: AC | PRN

## 2022-09-04 NOTE — ED Provider Notes (Signed)
[place] [facility] AT [facility] Provider Note   CSN: [id number] Arrival date & time: 09/04/22  4098     History  Chief Complaint  Patient presents with   Flank Pain    Katherine Chavez is a 47 y.o. female.  Patient with a history of depression and anxiety here with "UTI symptoms" for the past 3 days.  Having urinary frequency, urgency, pelvic pressure and pain.  States only able to urinate small amounts.  Has not noticed any blood in the urine.  Has diffuse crampy pelvic and low back pain.  No abnormal vaginal bleeding or discharge.  Several episodes of nausea and vomiting this morning.  No fever at home but has had chills.  No chest pain or shortness of breath.  Taking Azo at home without relief as well as ibuprofen.  Feels like she is not emptying her bladder completely and always feels like she needs to urinate.  Still has appendix and gallbladder.  No history of kidney stones.  The history is provided by the patient.  Flank Pain Associated symptoms include abdominal pain. Pertinent negatives include no chest pain, no headaches and no shortness of breath.       Home Medications Prior to Admission medications   Medication Sig Start Date End Date Taking? Authorizing Provider  FLUoxetine (PROZAC) 10 MG tablet Start 10 mg for two weeks, then 20 mg daily 04/21/16   Neysa Hotter, MD  traZODone (DESYREL) 100 MG tablet Take 100 mg by mouth at bedtime. 04/08/16   [provider]  zolpidem (AMBIEN) 5 MG tablet Take 1 tablet (5 mg total) by mouth at bedtime as needed for sleep. 04/21/16   Neysa Hotter, MD      Allergies    Oseltamivir    Review of Systems   Review of Systems  Constitutional:  Positive for chills. Negative for activity change, appetite change and fever.  Respiratory:  Negative for cough, chest tightness and shortness of breath.   Cardiovascular:  Negative for chest pain.  Gastrointestinal:  Positive for abdominal pain, nausea and  vomiting.  Genitourinary:  Positive for decreased urine volume, difficulty urinating, dysuria, flank pain, frequency, pelvic pain and urgency. Negative for hematuria.  Musculoskeletal:  Positive for back pain. Negative for arthralgias and myalgias.  Skin:  Negative for rash.  Neurological:  Negative for dizziness, weakness and headaches.   all other systems are negative except as noted in the HPI and PMH.    Physical Exam Updated Vital Signs BP 115/68   Pulse 87   Temp 97.7 F (36.5 C) (Oral)   Resp 18   Ht 5\' 6"  (1.676 m)   Wt 63.5 kg   SpO2 99%   BMI 22.60 kg/m  Physical Exam Vitals and nursing note reviewed.  Constitutional:      General: She is not in acute distress.    Appearance: She is well-developed.     Comments: Actively vomiting Pale appearing  HENT:     Head: Normocephalic and atraumatic.     Mouth/Throat:     Pharynx: No oropharyngeal exudate.  Eyes:     Conjunctiva/sclera: Conjunctivae normal.     Pupils: Pupils are equal, round, and reactive to light.  Neck:     Comments: No meningismus. Cardiovascular:     Rate and Rhythm: Normal rate and regular rhythm.     Heart sounds: Normal heart sounds. No murmur heard. Pulmonary:     Effort: Pulmonary effort is normal. No respiratory distress.  Breath sounds: Normal breath sounds.  Abdominal:     Palpations: Abdomen is soft.     Tenderness: There is abdominal tenderness. There is no guarding or rebound.     Comments: Suprapubic tenderness  Musculoskeletal:        General: Tenderness present. Normal range of motion.     Cervical back: Normal range of motion and neck supple.     Comments: Paraspinal lumbar tenderness, no CVA tenderness  Skin:    General: Skin is warm.  Neurological:     Mental Status: She is alert and oriented to person, place, and time.     Cranial Nerves: No cranial nerve deficit.     Motor: No abnormal muscle tone.     Coordination: Coordination normal.     Comments: No ataxia on  finger to nose bilaterally. No pronator drift. 5/5 strength throughout. CN 2-12 intact.Equal grip strength. Sensation intact.   Psychiatric:        Behavior: Behavior normal.     ED Results / Procedures / Treatments   Labs (all labs ordered are listed, but only abnormal results are displayed) Labs Reviewed  CBC WITH DIFFERENTIAL/PLATELET - Abnormal; Notable for the following components:      Result Value   Hemoglobin 11.8 (*)    All other components within normal limits  BASIC METABOLIC PANEL - Abnormal; Notable for the following components:   Potassium 3.0 (*)    Glucose, Bld 106 (*)    All other components within normal limits  URINALYSIS, ROUTINE W REFLEX MICROSCOPIC - Abnormal; Notable for the following components:   Color, Urine ORANGE (*)    APPearance CLOUDY (*)    Hgb urine dipstick SMALL (*)    Bilirubin Urine MODERATE (*)    Protein, ur 30 (*)    Nitrite POSITIVE (*)    Leukocytes,Ua MODERATE (*)    Bacteria, UA MANY (*)    All other components within normal limits  PREGNANCY, URINE    EKG None  Radiology No results found.  Procedures Procedures    Medications Ordered in ED Medications  lactated ringers bolus 1,000 mL (has no administration in time range)  ondansetron (ZOFRAN) injection 4 mg (has no administration in time range)  ketorolac (TORADOL) 30 MG/ML injection 15 mg (has no administration in time range)    ED Course/ Medical Decision Making/ A&P                             Medical Decision Making Amount and/or Complexity of Data Reviewed Labs: ordered. Decision-making details documented in ED Course. Radiology: ordered and independent interpretation performed. Decision-making details documented in ED Course. ECG/medicine tests: ordered and independent interpretation performed. Decision-making details documented in ED Course.  Risk Prescription drug management.   UTI symptoms with chills, pelvic pain, low back pain, nausea and vomiting.   Vital stable, no distress, afebrile.  Abdomen soft without peritoneal signs.  Will hydrate, treat symptoms, check labs and urinalysis. Bladder scan.  Consider possible pyelonephritis, possible kidney stone.  Labs and UA pending at shift change.  Will likely need CT renal study as well.   Dr Lockie Mola to assume care.         Final Clinical Impression(s) / ED Diagnoses Final diagnoses:  None    Rx / DC Orders ED Discharge Orders     None         Elysia Grand, Jeannett Senior, MD 09/04/22 [phone]

## 2022-09-04 NOTE — Discharge Instructions (Addendum)
You are diagnosed with a kidney stone today.  Recommend 600 mg ibuprofen every 8 hours as needed for pain.  Recommend 1000 mg of Tylenol every 6 hours as needed for pain.  I have written you for narcotic pain medicine called Roxicodone for breakthrough pain.  I prescribed you Zofran for nausea.  I prescribed you Flomax to help move the stone as well.  Drink plenty of fluids.  Please return if you have worsening pain, nausea, vomiting that is uncontrollable despite pain medicine at home.  If you develop a fever greater than 100.4 as well return for evaluation.  Overall I suspect that this kidney stone should pass on its own.  If you develop fever greater than 100.4 recommend that you go to [facility] emergency department as if you need any procedure for removal of this kidney stone that procedure would be done there.

## 2022-09-04 NOTE — ED Notes (Signed)
Patient transported to CT 

## 2022-09-04 NOTE — ED Provider Notes (Addendum)
Patient has 3 mm obstructive kidney stone in the distal third of the left ureter.  Otherwise lab works unremarkable.  No significant leukocytosis.  No fever.  However, urinalysis with pyuria. However, no fever, no white count. No concern for sepsis at the time. Talked with Dr. Berneice Heinrich with urology, give no fever, no white count, pain controlled.  Can give her strict return precautions.  Patient was given a dose of Rocephin here in ED.  Will prescribe Keflex as well.  Overall patient understands return precautions.  Given that the kidney stone is 3 mm we can try outpatient treatment at this time.  Discharged in good condition.  This chart was dictated using voice recognition software.  Despite best efforts to proofread,  errors can occur which can change the documentation meaning.    Katherine Norfolk, DO 09/04/22 0750    Katherine Norfolk, DO 09/04/22 [phone]

## 2022-09-04 NOTE — ED Notes (Signed)
Dc instructions reviewed with patient. Patient voiced understanding. Dc with belongings.  °

## 2022-09-04 NOTE — ED Triage Notes (Addendum)
POV from home, A&O x 4, amb to room, GCS 15  Pt c/o left sided flank pain that radiates to lower abd, nausea, painful urination x 2 days. Took AZO at home.

## 2022-09-05 LAB — URINE CULTURE: Culture: 10000 — AB

## 2022-09-06 ENCOUNTER — Telehealth (HOSPITAL_BASED_OUTPATIENT_CLINIC_OR_DEPARTMENT_OTHER): Payer: Self-pay | Admitting: *Deleted

## 2022-09-06 DIAGNOSIS — N200 Calculus of kidney: Secondary | ICD-10-CM | POA: Diagnosis not present

## 2022-09-06 NOTE — Telephone Encounter (Signed)
Post ED Visit - Positive Culture Follow-up  Culture report reviewed by antimicrobial stewardship pharmacist: [facility] [x]  Daylene Posey, Pharm.D. []  Celedonio Miyamoto, Pharm.D., BCPS AQ-ID []  Garvin Fila, Pharm.D., BCPS []  Georgina Pillion, Pharm.D., BCPS []  [place], [address].D., BCPS, AAHIVP []  Estella Husk, Pharm.D., BCPS, AAHIVP []  Lysle Pearl, PharmD, BCPS []  Phillips Climes, PharmD, BCPS []  Agapito Games, PharmD, BCPS []  Verlan Friends, PharmD []  Mervyn Gay, PharmD, BCPS []  Vinnie Level, PharmD  [facility] []  Len Childs, PharmD []  Greer Pickerel, PharmD []  Adalberto Cole, PharmD []  Perlie Gold, Rph []  Lonell Face) Jean Rosenthal, PharmD []  Earl Many, PharmD []  Junita Push, PharmD []  Dorna Leitz, PharmD []  Terrilee Files, PharmD []  Lynann Beaver, PharmD []  Keturah Barre, PharmD []  Loralee Pacas, PharmD []  Bernadene Person, PharmD   Positive urine culture Treated with Cephalexin, organism sensitive to the same and no further patient follow-up is required at this time.  Bing Quarry 09/06/2022, 9:35 AM
